# Patient Record
Sex: Male | Born: 1955 | Hispanic: Yes | Marital: Married
Health system: Midwestern US, Academic
[De-identification: ages and names within clinical notes are randomized; demographics above are authoritative.]

---

## 2016-10-25 ENCOUNTER — Encounter: Admit: 2016-10-25 | Discharge: 2016-10-26

## 2016-10-25 DIAGNOSIS — R69 Illness, unspecified: Principal | ICD-10-CM

## 2016-10-29 ENCOUNTER — Encounter: Admit: 2016-10-29 | Discharge: 2016-10-29

## 2016-10-29 DIAGNOSIS — R69 Illness, unspecified: Principal | ICD-10-CM

## 2016-11-02 ENCOUNTER — Encounter: Admit: 2016-11-02 | Discharge: 2016-11-02

## 2016-11-06 ENCOUNTER — Ambulatory Visit: Admit: 2016-11-06 | Discharge: 2016-11-06

## 2016-11-06 ENCOUNTER — Encounter: Admit: 2016-11-06 | Discharge: 2016-11-06

## 2016-11-06 DIAGNOSIS — N200 Calculus of kidney: Principal | ICD-10-CM

## 2016-11-06 DIAGNOSIS — I10 Essential (primary) hypertension: Principal | ICD-10-CM

## 2016-11-06 DIAGNOSIS — M546 Pain in thoracic spine: ICD-10-CM

## 2016-11-06 NOTE — Progress Notes
Date of Service: 11/06/2016   Problem   Nephrolithiasis   Back Pain              Assessment and Plan:  In Summary pt with hx of nephrolithiasis and upper back pain.  Last Image study showed small 5-6 mm non-obstructive stone. I spent 20 minutes with patient explaining the it is very unlikely that the source of the back pain is the  non-obstructive kidney stone. Most likely related to musculoskeletal system. My recommendations are: discuss PCP a weight loss and physical activity plan, stretching, acupuncture and if after 6 months of weight loss and consistent physical activity the re is no relief of symptoms than consider referral to orthopedics for spine evaluation.      For Nephrolithiasis, the recommendations are:     renal ultrasound in 6 -9 months     Diet modifications :    Drink plenty of water in order to urinate 2.5 liters a day (83 Oz)  Reduce your sodium ingestion to less than 1500mg  a day  Have a regular calcium diet (1200mg )  Drink lemon juice (fresh squeezed lemon or lyme) 186ml/day (4Oz)  Avoid food rich in oxalate -check link (Pool[employer]fr)    If he want to have stone treatment due to the size and to find out if he pain goes away after the stone removal, he can contact the clinic and we will order a ct scan for surgical planning              Subjective:             Hunter Mccarty is a 61 y.o. male.    Chief Complaint   Patient presents with   ??? Back Pain       History of Present Illness    Hunter Mccarty is a 61 year old [facility] patient who came to the clinic for evaluation due to his right back pain.  The pain started 3 weeks ago spontaneously at the upper thoracic back on the right with no radiation sharp constant and moderate.  It gets better with rest and worse with movements.  He went to his primary care doctor and during a workup ultrasound was performed and he was found that patient had a 6 mm nonobstructive right kidney stone.  He was then referred to me for evaluation.  Today patient persists with his upper back pain.  Denies any signs of infection denies any urinary symptoms.    Past Medical History:   Diagnosis Date   ??? Hypertension    ??? Kidney stones        History reviewed. No pertinent surgical history.    Social History     Social History   ??? Marital status: Married     Spouse name: N/A   ??? Number of children: N/A   ??? Years of education: N/A     Social History Main Topics   ??? Smoking status: Unknown If Ever Smoked   ??? Smokeless tobacco: Never Used   ??? Alcohol use No   ??? Drug use: Unknown   ??? Sexual activity: Yes     Other Topics Concern   ??? Not on file     Social History Narrative   ??? No narrative on file        History reviewed. No pertinent family history.    No Known Allergies         Review of Systems   Constitutional: Negative for chills and fever.  HENT: Negative.    Eyes: Negative.    Respiratory: Negative.    Gastrointestinal: Negative.  Negative for abdominal pain.   Endocrine: Negative.    Genitourinary: Negative for difficulty urinating, dysuria, flank pain, hematuria, penile pain and testicular pain.   Musculoskeletal: Negative.    Skin: Negative.    Allergic/Immunologic: Negative.    Neurological: Negative.    Hematological: Negative.    Psychiatric/Behavioral: Negative.        Objective:    MEDICATION:           ??? ciprofloxacin (CIPRO) 500 mg tablet TAKE ONE TABLET BY MOUTH EVERY TWELVE HOURS   ??? lisinopril (PRINIVIL; ZESTRIL) 10 mg tablet Take 10 mg by mouth daily.   ??? metFORMIN (GLUCOPHAGE) 500 mg tablet TAKE 1 TABLET BY MOUTH TWO TIMES A DAY   ??? simvastatin (ZOCOR) 20 mg tablet Take 20 mg by mouth daily.   ??? tamsulosin (FLOMAX) 0.4 mg capsule Take 0.4 mg by mouth daily.       PHYSICAL EXAM:    Vitals:    11/06/16 1405   BP: 148/76   Pulse: 73   Weight: 99.3 kg (219 lb)   Height: 172.7 cm (68)       Body mass index is 33.3 kg/m???.     Physical Exam Constitutional: He is oriented to person, place, and time. He appears well-developed and well-nourished.   HENT:   Head: Normocephalic and atraumatic.   Eyes: Pupils are equal, round, and reactive to light. Conjunctivae are normal.   Neck: Normal range of motion. Neck supple.   Cardiovascular: Normal rate and regular rhythm.    Pulmonary/Chest: Breath sounds normal.   Abdominal: Soft.   Genitourinary: Penis normal.   Musculoskeletal: Normal range of motion. He exhibits tenderness.   Tenderness on palpation on the upper back by the 10th rib in the right.   Neurological: He is alert and oriented to person, place, and time.   Skin: Skin is warm.   Psychiatric: He has a normal mood and affect. His behavior is normal. Judgment normal.       LABS:    No results found for: CULTURE  No results found for: UGLUPOC, UBILIPOC, UKETPOC, USPGRPOC, UBLDPOC, UPHPOC, UPROTPOC, UROBPOC, ULEUKPOC, UCOLOR, TURBID  No results found for: NA, K, CL, CO2, GAP, GLU, BUN, CR, CA, GFR, GFRAA    Radiology:    I reviewed myself the US of the patient. Patient has a 6 mm kidney stone on theleft side.             Problem   Nephrolithiasis   Back Pain                Hunter Busing, MD, [facility]  Urology

## 2016-12-13 ENCOUNTER — Encounter: Admit: 2016-12-13 | Discharge: 2016-12-13

## 2016-12-13 DIAGNOSIS — R319 Hematuria, unspecified: Principal | ICD-10-CM

## 2016-12-15 ENCOUNTER — Encounter: Admit: 2016-12-15 | Discharge: 2016-12-15

## 2016-12-17 ENCOUNTER — Encounter: Admit: 2016-12-17 | Discharge: 2016-12-17

## 2016-12-17 DIAGNOSIS — R69 Illness, unspecified: Principal | ICD-10-CM

## 2016-12-26 ENCOUNTER — Encounter: Admit: 2016-12-26 | Discharge: 2016-12-26

## 2016-12-26 DIAGNOSIS — K869 Disease of pancreas, unspecified: Principal | ICD-10-CM
